# Patient Record
Sex: Male | Born: 1970
Health system: Southern US, Community
[De-identification: ages and names within clinical notes are randomized; demographics above are authoritative.]

## PROBLEM LIST (undated history)

## (undated) DIAGNOSIS — K219 Gastro-esophageal reflux disease without esophagitis: Secondary | ICD-10-CM

## (undated) DIAGNOSIS — I10 Essential (primary) hypertension: Secondary | ICD-10-CM

## (undated) DIAGNOSIS — E119 Type 2 diabetes mellitus without complications: Secondary | ICD-10-CM

## (undated) DIAGNOSIS — J309 Allergic rhinitis, unspecified: Secondary | ICD-10-CM

## (undated) DIAGNOSIS — J069 Acute upper respiratory infection, unspecified: Secondary | ICD-10-CM

## (undated) HISTORY — DX: Allergic rhinitis, unspecified: J30.9

## (undated) HISTORY — PX: HAND SURGERY: SHX662

## (undated) HISTORY — PX: SHOULDER SURGERY: SHX246

## (undated) HISTORY — PX: ESOPHAGEAL DILATION: SHX303

## (undated) HISTORY — DX: Essential (primary) hypertension: I10

## (undated) HISTORY — DX: Type 2 diabetes mellitus without complications: E11.9

## (undated) HISTORY — DX: Acute upper respiratory infection, unspecified: J06.9

## (undated) HISTORY — DX: Gastro-esophageal reflux disease without esophagitis: K21.9

## (undated) HISTORY — PX: KNEE SURGERY: SHX244

---

## 2003-11-03 ENCOUNTER — Encounter: Admission: RE | Admit: 2003-11-03 | Discharge: 2003-11-03 | Payer: Self-pay | Admitting: Family Medicine

## 2010-01-02 ENCOUNTER — Encounter: Admission: RE | Admit: 2010-01-02 | Discharge: 2010-01-02 | Payer: Self-pay | Admitting: Specialist

## 2011-02-18 ENCOUNTER — Other Ambulatory Visit: Payer: Self-pay | Admitting: Gastroenterology

## 2011-02-28 ENCOUNTER — Ambulatory Visit
Admission: RE | Admit: 2011-02-28 | Discharge: 2011-02-28 | Disposition: A | Discharge status: Home / Self Care | Payer: 59 | Source: Ambulatory Visit | Attending: Gastroenterology | Admitting: Gastroenterology

## 2011-04-08 ENCOUNTER — Ambulatory Visit (HOSPITAL_COMMUNITY)
Admission: RE | Admit: 2011-04-08 | Discharge: 2011-04-08 | Disposition: A | Discharge status: Home / Self Care | Payer: 59 | Source: Ambulatory Visit | Attending: Gastroenterology | Admitting: Gastroenterology

## 2011-04-08 DIAGNOSIS — K222 Esophageal obstruction: Secondary | ICD-10-CM | POA: Insufficient documentation

## 2011-04-08 DIAGNOSIS — K219 Gastro-esophageal reflux disease without esophagitis: Secondary | ICD-10-CM | POA: Insufficient documentation

## 2011-04-08 DIAGNOSIS — Z79899 Other long term (current) drug therapy: Secondary | ICD-10-CM | POA: Insufficient documentation

## 2011-04-08 DIAGNOSIS — K449 Diaphragmatic hernia without obstruction or gangrene: Secondary | ICD-10-CM | POA: Insufficient documentation

## 2011-04-08 LAB — GLUCOSE, CAPILLARY: Glucose-Capillary: 122 mg/dL — ABNORMAL HIGH (ref 70–99)

## 2011-04-16 NOTE — Op Note (Signed)
  NAMEMarland Kitchen  AL, BRACEWELL NO.:  [id number]  MEDICAL RECORD NO.:  [id number]           PATIENT TYPE:  O  LOCATION:  [facility]                         FACILITY:  [facility]  PHYSICIAN:  Graylin Shiver, M.D.   DATE OF BIRTH:  [birth year]  DATE OF PROCEDURE:  04/08/2011 DATE OF DISCHARGE:                              OPERATIVE REPORT   PROCEDURE:  Esophagogastroduodenoscopy with endoscopic balloon dilatation of a Schatzki ring.  INDICATIONS FOR PROCEDURE:  The patient is a 41 year old male with intermittent dysphagia to solid foods.  A barium swallow showed a Schatzki ring and a small hiatal hernia.  Informed consent was obtained after explanation of the risks of bleeding, infection, and perforation.  PREMEDICATION: 1. Fentanyl 75 mcg IV. 2. Versed 6 mg IV.  PROCEDURE:  With the patient in the left lateral decubitus position, the Pentax gastroscope was inserted into the oropharynx and passed into the esophagus.  It was advanced down the esophagus to the lower esophagus where a Schatzki ring was noted.  There was initially a little resistance to the passage of the scope into the stomach.  After passing the scope into the stomach, a hiatal hernia was present.  The scope was advanced to the second portion of the duodenum.  The second portion and bulb of the duodenum were normal.  The gastric mucosa looked normal.  No lesions were seen in the fundus or cardia of the stomach.  An endoscopic balloon dilator was advanced down the scope and placed at the level of the Schatzki ring.  It was inflated to 15 mm and held in place for 1 minute.  It was then increased in size to 16.5 mm and held in place for 1 minute.  The balloon was then deflated and removed.  There was some heme at the site of the dilation.  The rest of the esophagus looked normal.  He tolerated the procedure well without complications.  IMPRESSION: 1. Schatzki ring dilated to 16.5 mm. 2. Hiatal hernia.  PLAN:   Observe response to the dilatation.          ______________________________ Graylin Shiver, M.D.     SFG/MEDQ  D:  04/08/2011  T:  04/09/2011  Job:  [id number]  cc:   Kandyce Rud, MD Fax: [phone]  Electronically Signed by Herbert Moors MD on 04/16/2011 10:04:14 AM

## 2014-04-21 ENCOUNTER — Telehealth: Payer: Self-pay | Admitting: Cardiovascular Disease

## 2014-04-21 NOTE — Telephone Encounter (Signed)
Closed encounter °

## 2014-04-27 ENCOUNTER — Telehealth: Payer: Self-pay | Admitting: Cardiovascular Disease

## 2014-04-27 ENCOUNTER — Ambulatory Visit: Payer: 59 | Admitting: Cardiovascular Disease

## 2014-04-28 NOTE — Telephone Encounter (Signed)
Closed encounter °

## 2014-06-07 ENCOUNTER — Ambulatory Visit (INDEPENDENT_AMBULATORY_CARE_PROVIDER_SITE_OTHER): Payer: 59 | Admitting: Cardiovascular Disease

## 2014-06-07 ENCOUNTER — Encounter: Payer: Self-pay | Admitting: Cardiovascular Disease

## 2014-06-07 VITALS — BP 146/72 | HR 65 | Ht 78.0 in | Wt 275.9 lb

## 2014-06-07 DIAGNOSIS — I10 Essential (primary) hypertension: Secondary | ICD-10-CM

## 2014-06-07 DIAGNOSIS — E119 Type 2 diabetes mellitus without complications: Secondary | ICD-10-CM | POA: Insufficient documentation

## 2014-06-07 NOTE — Progress Notes (Signed)
     06/07/2014 Mitchell Rodriguez   07/04/1971  [id number]  Primary Physician Darrow BussingKOIRALA,DIBAS, MD Primary Cardiologist: Runell GessJonathan J. Zimere Dunlevy MD Roseanne RenoFACP,FACC,FAHA, FSCAI   HPI:  Mitchell Rodriguez is  a delightful 43 year old moderately overweight married Caucasian male father of 2 children patient of Dr. Docia ChuckKoirala . 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 He is a positive family history of heart disease the father had bypass surgery in his late 4940s. History of hypertension and type 2 diabetes. He has never had a heart attack or stroke and denies chest pain or shortness of breath   Current Outpatient Prescriptions  Medication Sig Dispense Refill  . aspirin 81 MG tablet Take 81 mg by mouth daily.      . hydrochlorothiazide (MICROZIDE) 12.5 MG capsule Take 1 capsule by mouth daily.       . metFORMIN (GLUCOPHAGE-XR) 500 MG 24 hr tablet Take 2 tablets by mouth daily.       No current facility-administered medications for this visit.    Allergies  Allergen Reactions  . Sulfur     History   Social History  . Marital Status: Married    Spouse Name: N/A    Number of Children: N/A  . Years of Education: N/A   Occupational History  . Not on file.   Social History Main Topics  . Smoking status: Never Smoker   . Smokeless tobacco: Never Used  . Alcohol Use: 0.5 - 1.0 oz/week    1-2 drink(s) per week     Comment: BEERS  . Drug Use: Not on file  . Sexual Activity: Not on file   Other Topics Concern  . Not on file   Social History Narrative  . No narrative on file     Review of Systems: General: negative for chills, fever, night sweats or weight changes.  Cardiovascular: negative for chest pain, dyspnea on exertion, edema, orthopnea, palpitations, paroxysmal nocturnal dyspnea or shortness of breath Dermatological: negative for rash Respiratory: negative for cough or wheezing Urologic: negative for  hematuria Abdominal: negative for nausea, vomiting, diarrhea, bright red blood per rectum, melena, or hematemesis Neurologic: negative for visual changes, syncope, or dizziness All other systems reviewed and are otherwise negative except as noted above.    Blood pressure 146/72, pulse 65, height 6\' 6"  (1.981 m), weight 275 lb 14.4 oz (125.147 kg).  General appearance: alert and no distress Neck: no adenopathy, no carotid bruit, no JVD, supple, symmetrical, trachea midline and thyroid not enlarged, symmetric, no tenderness/mass/nodules Lungs: clear to auscultation bilaterally Heart: regular rate and rhythm, S1, S2 normal, no murmur, click, rub or gallop Extremities: extremities normal, atraumatic, no cyanosis or edema  EKG normal sinus rhythm at 65 without ST or T wave changes  ASSESSMENT AND PLAN:   Essential hypertension Controlled on current medications  Diabetes Managed and followed by his PCP      Runell GessJonathan J. Makylie Rivere MD [facility] Lakes[facility] HospitalFACP,FACC,FAHA, [facility] CenterFSCAI 06/07/2014 10:31 AM

## 2014-06-07 NOTE — Patient Instructions (Signed)
Your physician has requested that you have a exercise tolerance test. For further information please visit [email or web address]. Please also follow instruction sheet, as given.  Your physician recommends that you schedule a follow-up appointment in: As needed or if test is abnormal.

## 2014-06-07 NOTE — Assessment & Plan Note (Signed)
Controlled on current medications 

## 2014-06-07 NOTE — Assessment & Plan Note (Signed)
Managed and followed by his PCP

## 2014-06-20 ENCOUNTER — Telehealth (HOSPITAL_COMMUNITY): Payer: Self-pay

## 2014-06-20 NOTE — Telephone Encounter (Signed)
Encounter complete. 

## 2014-06-22 ENCOUNTER — Encounter (HOSPITAL_COMMUNITY): Payer: 59

## 2014-06-30 ENCOUNTER — Telehealth (HOSPITAL_COMMUNITY): Payer: Self-pay

## 2014-06-30 NOTE — Telephone Encounter (Signed)
Encounter complete. 

## 2014-07-05 ENCOUNTER — Ambulatory Visit (HOSPITAL_COMMUNITY)
Admission: RE | Admit: 2014-07-05 | Discharge: 2014-07-05 | Disposition: A | Discharge status: Home / Self Care | Payer: 59 | Source: Ambulatory Visit | Attending: Cardiovascular Disease | Admitting: Cardiovascular Disease

## 2014-07-05 DIAGNOSIS — I10 Essential (primary) hypertension: Secondary | ICD-10-CM | POA: Diagnosis present

## 2014-07-05 DIAGNOSIS — Z8249 Family history of ischemic heart disease and other diseases of the circulatory system: Secondary | ICD-10-CM

## 2014-07-05 NOTE — Procedures (Signed)
Exercise Treadmill Test   Test  Exercise Tolerance Test Ordering MD: Nanetta BattyJonathan Berry, MD  Interpreting MD:  Unique Test No:1  Treadmill:  1  Indication for ETT: Work Physical/Family History CAD-Evaluate for Ischemia  Contraindication to ETT: No   Stress Modality: exercise - treadmill  Cardiac Imaging Performed: non   Protocol: standard Bruce - maximal  Max BP:  199/109  Max MPHR (bpm): 175 85% MPR (bpm):  149  MPHR obtained (bpm): 176 % MPHR obtained:  100  Reached 85% MPHR (min:sec):  9:05 Total Exercise Time (min-sec):  11:01  Workload in METS: 13.40 Borg Scale:   Reason ETT Terminated:  leg cramps    ST Segment Analysis At Rest: normal ST segments - no evidence of significant ST depression With Exercise: no evidence of significant ST depression  Other Information Arrhythmia:  No Angina during ETT:  absent (0) Quality of ETT:  diagnostic  ETT Interpretation:  normal - no evidence of ischemia by ST analysis  Comments: Duke Score of +11 Excellent exercise tolerance Hypertensive BP response to exercise Normal HR and BP recovery  Chrystie NoseKenneth C. Carollee Nussbaumer, MD, [facility] HospitalFACC Attending Cardiologist [facility]

## 2014-07-07 ENCOUNTER — Encounter: Payer: Self-pay | Admitting: *Deleted

## 2016-12-08 ENCOUNTER — Ambulatory Visit (INDEPENDENT_AMBULATORY_CARE_PROVIDER_SITE_OTHER): Payer: 59 | Admitting: Allergy and Immunology

## 2016-12-08 ENCOUNTER — Encounter: Payer: Self-pay | Admitting: Allergy and Immunology

## 2016-12-08 ENCOUNTER — Encounter (INDEPENDENT_AMBULATORY_CARE_PROVIDER_SITE_OTHER): Payer: Self-pay

## 2016-12-08 VITALS — BP 138/78 | HR 78 | Temp 98.5°F | Resp 16 | Ht 75.0 in | Wt 299.0 lb

## 2016-12-08 DIAGNOSIS — J3089 Other allergic rhinitis: Secondary | ICD-10-CM | POA: Insufficient documentation

## 2016-12-08 DIAGNOSIS — J320 Chronic maxillary sinusitis: Secondary | ICD-10-CM

## 2016-12-08 DIAGNOSIS — J329 Chronic sinusitis, unspecified: Secondary | ICD-10-CM | POA: Insufficient documentation

## 2016-12-08 DIAGNOSIS — I10 Essential (primary) hypertension: Secondary | ICD-10-CM | POA: Diagnosis not present

## 2016-12-08 DIAGNOSIS — R062 Wheezing: Secondary | ICD-10-CM

## 2016-12-08 MED ORDER — FLUTICASONE PROPIONATE 50 MCG/ACT NA SUSP: 1.0000 | Freq: Every day | NASAL | 5 refills | Status: DC | PRN

## 2016-12-08 MED ORDER — AZELASTINE HCL 0.1 % NA SOLN: 1.0000 | Freq: Two times a day (BID) | NASAL | 5 refills | Status: DC | PRN

## 2016-12-08 MED ORDER — ALBUTEROL SULFATE 108 (90 BASE) MCG/ACT IN AEPB: 1.0000 | INHALATION_SPRAY | RESPIRATORY_TRACT | 1 refills | Status: DC

## 2016-12-08 MED ORDER — AZELASTINE-FLUTICASONE 137-50 MCG/ACT NA SUSP: 1.0000 | Freq: Every day | NASAL | 5 refills | Status: DC | PRN

## 2016-12-08 NOTE — Addendum Note (Signed)
Addended by: Mliss FritzBLACK, Pattie Flaharty I on: 12/08/2016 11:03 AM   Modules accepted: Orders

## 2016-12-08 NOTE — Assessment & Plan Note (Signed)
   Currently well controlled with antihypertensives, however I recommended against the use of pseudoephedrine or phenylephrine as these medications may elevate blood pressure.

## 2016-12-08 NOTE — Patient Instructions (Addendum)
Perennial and seasonal allergic rhinitis  Aeroallergen avoidance measures have been discussed and provided in written form.  A prescription has been provided for Dymista (azelastine/fluticasone) nasal spray, 1 spray per nostril twice daily as needed. Proper nasal spray technique has been discussed and demonstrated.  Nasal saline lavage (NeilMed) has been recommended prior to medicated nasal sprays and as needed along with instructions for proper administration.  For thick post nasal drainage, add guaifenesin 1200 mg (Mucinex Maximum Strength)  twice daily as needed with adequate hydration as discussed.  If allergen avoidance measures and medications fail to adequately relieve symptoms, aeroallergen immunotherapy will be considered.  Coughing/wheezing  Treatment plan as outlined above.  A prescription has been provided for ProAir Respiclick, 1-2 inhalations every 4-6 hours as needed.  Subjective and objective measures of pulmonary function will be followed and the treatment plan will be adjusted accordingly.  Essential hypertension  Currently well controlled with antihypertensives, however I recommended against the use of pseudoephedrine or phenylephrine as these medications may elevate blood pressure.   Return in about 4 months (around 04/07/2017), or if symptoms worsen or fail to improve.  Reducing Pollen Exposure  The American Academy of Allergy, Asthma and Immunology suggests the following steps to reduce your exposure to pollen during allergy seasons.    1. Do not hang sheets or clothing out to dry; pollen may collect on these items. 2. Do not mow lawns or spend time around freshly cut grass; mowing stirs up pollen. 3. Keep windows closed at night.  Keep car windows closed while driving. 4. Minimize morning activities outdoors, a time when pollen counts are usually at their highest. 5. Stay indoors as much as possible when pollen counts or humidity is high and on windy days when  pollen tends to remain in the air longer. 6. Use air conditioning when possible.  Many air conditioners have filters that trap the pollen spores. 7. Use a HEPA room air filter to remove pollen form the indoor air you breathe.   Control of Mold Allergen  Mold and fungi can grow on a variety of surfaces provided certain temperature and moisture conditions exist.  Outdoor molds grow on plants, decaying vegetation and soil.  The major outdoor mold, Alternaria and Cladosporium, are found in very high numbers during hot and dry conditions.  Generally, a late Summer - Fall peak is seen for common outdoor fungal spores.  Rain will temporarily lower outdoor mold spore count, but counts rise rapidly when the rainy period ends.  The most important indoor molds are Aspergillus and Penicillium.  Dark, humid and poorly ventilated basements are ideal sites for mold growth.  The next most common sites of mold growth are the bathroom and the kitchen.  Outdoor [employer] 1. Use air conditioning and keep windows closed 2. Avoid exposure to decaying vegetation. 3. Avoid leaf raking. 4. Avoid grain handling. 5. Consider wearing a face mask if working in moldy areas.  Indoor Mold Control 1. Maintain humidity below 50%. 2. Clean washable surfaces with 5% bleach solution. 3. Remove sources e.g. Contaminated carpets.  Control of Dog or Cat Allergen  Avoidance is the best way to manage a dog or cat allergy. If you have a dog or cat and are allergic to dog or cats, consider removing the dog or cat from the home. If you have a dog or cat but don't want to find it a new home, or if your family wants a pet even though someone in the household is  allergic, here are some strategies that may help keep symptoms at bay:  1. Keep the pet out of your bedroom and restrict it to only a few rooms. Be advised that keeping the dog or cat in only one room will not limit the allergens to that room. 2. Don't pet, hug or kiss the  dog or cat; if you do, wash your hands with soap and water. 3. High-efficiency particulate air (HEPA) cleaners run continuously in a bedroom or living room can reduce allergen levels over time. 4. Regular use of a high-efficiency vacuum cleaner or a central vacuum can reduce allergen levels. 5. Giving your dog or cat a bath at least once a week can reduce airborne allergen.  Control of House Dust Mite Allergen  House dust mites play a major role in allergic asthma and rhinitis.  They occur in environments with high humidity wherever human skin, the food for dust mites is found. High levels have been detected in dust obtained from mattresses, pillows, carpets, upholstered furniture, bed covers, clothes and soft toys.  The principal allergen of the house dust mite is found in its feces.  A gram of dust may contain 1,000 mites and 250,000 fecal particles.  Mite antigen is easily measured in the air during house cleaning activities.    1. Encase mattresses, including the box spring, and pillow, in an air tight cover.  Seal the zipper end of the encased mattresses with wide adhesive tape. 2. Wash the bedding in water of 130 degrees Farenheit weekly.  Avoid cotton comforters/quilts and flannel bedding: the most ideal bed covering is the dacron comforter. 3. Remove all upholstered furniture from the bedroom. 4. Remove carpets, carpet padding, rugs, and non-washable window drapes from the bedroom.  Wash drapes weekly or use plastic window coverings. 5. Remove all non-washable stuffed toys from the bedroom.  Wash stuffed toys weekly. 6. Have the room cleaned frequently with a vacuum cleaner and a damp dust-mop.  The patient should not be in a room which is being cleaned and should wait 1 hour after cleaning before going into the room. 7. Close and seal all heating outlets in the bedroom.  Otherwise, the room will become filled with dust-laden air.  An electric heater can be used to heat the room. 8. Reduce  indoor humidity to less than 50%.  Do not use a humidifier.  Control of Cockroach Allergen  Cockroach allergen has been identified as an important cause of acute attacks of asthma, especially in urban settings.  There are fifty-five species of cockroach that exist in the [place], however only three, the [place], [place] and Oriental species produce allergen that can affect patients with Asthma.  Allergens can be obtained from fecal particles, egg casings and secretions from cockroaches.    1. Remove food sources. 2. Reduce access to water. 3. Seal access and entry points. 4. Spray runways with 0.5-1% Diazinon or Chlorpyrifos 5. Blow boric acid power under stoves and refrigerator. 6. Place bait stations (hydramethylnon) at feeding sites.

## 2016-12-08 NOTE — Progress Notes (Addendum)
New Patient Note  RE: Mitchell Rodriguez MRN: [id number] DOB: 09/07/1971 Date of Office Visit: 12/08/2016  Referring provider: Darrow Bussing, MD Primary care provider: Darrow Bussing, MD  Chief Complaint: Sinus Problem and Allergic Rhinitis    History of present illness: Mitchell Rodriguez is a 46 y.o. male seen today in consultation requested by Darrow Bussing, MD.  He reports that for many years he has experienced nasal congestion "all the time", as well as rhinorrhea, postnasal drainage, and occasional sinus pressure.  These symptoms occur year round but tend to be worse in the fall and in the winter.  Since summer of 2017 he has had 3 sinus infections requiring antibiotics.  He currently takes fluticasone nasal spray and over-the-counter loratadine with modest benefit.  He states that he often times evolves bronchitis during sinus infections with symptoms consisting of coughing and wheezing.   Assessment and plan: Perennial and seasonal allergic rhinitis  Aeroallergen avoidance measures have been discussed and provided in written form.  A prescription has been provided for Dymista (azelastine/fluticasone) nasal spray, 1 spray per nostril twice daily as needed. Proper nasal spray technique has been discussed and demonstrated.  Nasal saline lavage (NeilMed) has been recommended prior to medicated nasal sprays and as needed along with instructions for proper administration.  For thick post nasal drainage, add guaifenesin 1200 mg (Mucinex Maximum Strength)  twice daily as needed with adequate hydration as discussed.  If allergen avoidance measures and medications fail to adequately relieve symptoms, aeroallergen immunotherapy will be considered.  Coughing/wheezing  Treatment plan as outlined above.  A prescription has been provided for ProAir Respiclick, 1-2 inhalations every 4-6 hours as needed.  Subjective and objective measures of pulmonary function will be followed and the  treatment plan will be adjusted accordingly.  Essential hypertension  Currently well controlled with antihypertensives, however I recommended against the use of pseudoephedrine or phenylephrine as these medications may elevate blood pressure.   Meds ordered this encounter  Medications  . DISCONTD: Azelastine-Fluticasone 137-50 MCG/ACT SUSP    Sig: Place 1 spray into the nose daily as needed.    Dispense:  23 g    Refill:  5  . Albuterol Sulfate (PROAIR RESPICLICK) 108 (90 Base) MCG/ACT AEPB    Sig: Inhale 1-2 puffs into the lungs See admin instructions. Every 4-6 hours as needed    Dispense:  1 each    Refill:  1  . azelastine (ASTELIN) 0.1 % nasal spray    Sig: Place 1 spray into both nostrils 2 (two) times daily as needed.    Dispense:  30 mL    Refill:  5  . fluticasone (FLONASE) 50 MCG/ACT nasal spray    Sig: Place 1 spray into both nostrils daily as needed for allergies or rhinitis.    Dispense:  16 g    Refill:  5    Diagnostics: Spirometry: FVC was 4.31 L and FEV1 was 3.83 L (80% predicted) without post bronchodilator improvement.  Please see scanned spirometry results for details. Allergy skin testing: Positive to grass pollens, ragweed pollen, weed pollens, tree pollens, molds, dog epithelia, cockroach antigen, and dust mite antigen.    Physical examination: Blood pressure 138/78, pulse 78, temperature 98.5 F (36.9 C), temperature source Oral, resp. rate 16, height 6\' 3"  (1.905 m), weight 299 lb (135.6 kg), SpO2 98 %.  General: Alert, interactive, in no acute distress. HEENT: TMs pearly gray, turbinates edematous without discharge, post-pharynx moderately erythematous. Neck: Supple without lymphadenopathy. Lungs: Clear to auscultation  without wheezing, rhonchi or rales. CV: Normal S1, S2 without murmurs. Abdomen: Nondistended, nontender. Skin: Warm and dry, without lesions or rashes. Extremities:  No clubbing, cyanosis or edema. Neuro:   Grossly intact.  Review  of systems:  Review of systems negative except as noted in HPI / PMHx or noted below: Review of Systems  Constitutional: Negative.   HENT: Negative.   Eyes: Negative.   Respiratory: Negative.   Cardiovascular: Negative.   Gastrointestinal: Negative.   Genitourinary: Negative.   Musculoskeletal: Negative.   Skin: Negative.   Neurological: Negative.   Endo/Heme/Allergies: Negative.   Psychiatric/Behavioral: Negative.     Past medical history:  Past Medical History:  Diagnosis Date  . Diabetes mellitus without complication (HCC)   . Hypertension   . Recurrent upper respiratory infection (URI)     Past surgical history:  Past Surgical History:  Procedure Laterality Date  . HAND SURGERY    . KNEE SURGERY     acl repair  . SHOULDER SURGERY     rotator cuff repair    Family history: Family History  Problem Relation Age of Onset  . Cancer - Other Mother     uterine  . Heart disease Father   . Hypertension Father   . Heart disease Maternal Grandmother   . COPD Maternal Grandmother   . Cancer - Other Paternal Grandfather   . Hypertension Brother     Social history: Social History   Social History  . Marital status: Married    Spouse name: N/A  . Number of children: N/A  . Years of education: N/A   Occupational History  . Not on file.   Social History Main Topics  . Smoking status: Never Smoker  . Smokeless tobacco: Never Used  . Alcohol use 0.5 - 1.0 oz/week    1 - 2 Standard drinks or equivalent per week     Comment: BEERS  . Drug use: No  . Sexual activity: Not on file   Other Topics Concern  . Not on file   Social History Narrative  . No narrative on file   Environmental History: The patient lives in a 49 year old house with carpeting throughout, gas heat, and central air.  There are dogs in the House which have access to his bedroom.  He is a nonsmoker but is exposed to fumes, chemicals, and dust at his place of employment.  Allergies as of  12/08/2016      Reactions   Sulfamethoxazole Other (See Comments)   unknown   Sulfur       Medication List       Accurate as of 12/08/16 11:05 AM. Always use your most recent med list.          aspirin 81 MG tablet Take 81 mg by mouth daily.   azelastine 0.1 % nasal spray Commonly known as:  ASTELIN Place 1 spray into both nostrils 2 (two) times daily as needed.   fluticasone 50 MCG/ACT nasal spray Commonly known as:  FLONASE Place 1 spray into both nostrils daily as needed for allergies or rhinitis.   glimepiride 1 MG tablet Commonly known as:  AMARYL Take 1 tablet by mouth daily.   hydrochlorothiazide 12.5 MG capsule Commonly known as:  MICROZIDE Take 1 capsule by mouth daily.   metFORMIN 500 MG 24 hr tablet Commonly known as:  GLUCOPHAGE-XR Take 4 tablets by mouth daily.   VENTOLIN HFA 108 (90 Base) MCG/ACT inhaler Generic drug:  albuterol   Albuterol Sulfate 108 (  90 Base) MCG/ACT Aepb Commonly known as:  PROAIR RESPICLICK Inhale 1-2 puffs into the lungs See admin instructions. Every 4-6 hours as needed       Known medication allergies: Allergies  Allergen Reactions  . Sulfamethoxazole Other (See Comments)    unknown  . Sulfur     I appreciate the opportunity to take part in Kelven's care. Please do not hesitate to contact me with questions.  Sincerely,   R. Jorene Guestarter Larkin Alfred, MD

## 2016-12-08 NOTE — Addendum Note (Signed)
Addended by: Candis SchatzBOBBITT, Kentrail Shew C on: 12/08/2016 11:05 AM   Modules accepted: Orders

## 2016-12-08 NOTE — Assessment & Plan Note (Addendum)
   Treatment plan as outlined above.  A prescription has been provided for ProAir Respiclick, 1-2 inhalations every 4-6 hours as needed.  Subjective and objective measures of pulmonary function will be followed and the treatment plan will be adjusted accordingly.

## 2016-12-08 NOTE — Assessment & Plan Note (Signed)
   Aeroallergen avoidance measures have been discussed and provided in written form.  A prescription has been provided for Dymista (azelastine/fluticasone) nasal spray, 1 spray per nostril twice daily as needed. Proper nasal spray technique has been discussed and demonstrated.  Nasal saline lavage (NeilMed) has been recommended prior to medicated nasal sprays and as needed along with instructions for proper administration.  For thick post nasal drainage, add guaifenesin 1200 mg (Mucinex Maximum Strength)  twice daily as needed with adequate hydration as discussed.  If allergen avoidance measures and medications fail to adequately relieve symptoms, aeroallergen immunotherapy will be considered.

## 2016-12-17 DIAGNOSIS — I10 Essential (primary) hypertension: Secondary | ICD-10-CM | POA: Diagnosis not present

## 2016-12-17 DIAGNOSIS — E1165 Type 2 diabetes mellitus with hyperglycemia: Secondary | ICD-10-CM | POA: Diagnosis not present

## 2016-12-17 DIAGNOSIS — R740 Nonspecific elevation of levels of transaminase and lactic acid dehydrogenase [LDH]: Secondary | ICD-10-CM | POA: Diagnosis not present

## 2017-02-02 DIAGNOSIS — J209 Acute bronchitis, unspecified: Secondary | ICD-10-CM | POA: Diagnosis not present

## 2017-02-11 DIAGNOSIS — J329 Chronic sinusitis, unspecified: Secondary | ICD-10-CM | POA: Diagnosis not present

## 2017-02-11 DIAGNOSIS — J45901 Unspecified asthma with (acute) exacerbation: Secondary | ICD-10-CM | POA: Diagnosis not present

## 2017-02-11 DIAGNOSIS — E1165 Type 2 diabetes mellitus with hyperglycemia: Secondary | ICD-10-CM | POA: Diagnosis not present

## 2017-02-11 DIAGNOSIS — J209 Acute bronchitis, unspecified: Secondary | ICD-10-CM | POA: Diagnosis not present

## 2017-02-25 DIAGNOSIS — R062 Wheezing: Secondary | ICD-10-CM | POA: Diagnosis not present

## 2017-03-31 DIAGNOSIS — J328 Other chronic sinusitis: Secondary | ICD-10-CM | POA: Diagnosis not present

## 2017-03-31 DIAGNOSIS — J342 Deviated nasal septum: Secondary | ICD-10-CM | POA: Diagnosis not present

## 2017-03-31 DIAGNOSIS — J209 Acute bronchitis, unspecified: Secondary | ICD-10-CM | POA: Diagnosis not present

## 2017-03-31 DIAGNOSIS — J329 Chronic sinusitis, unspecified: Secondary | ICD-10-CM | POA: Diagnosis not present

## 2017-04-02 ENCOUNTER — Other Ambulatory Visit: Payer: Self-pay | Admitting: Pulmonary Disease

## 2017-04-02 ENCOUNTER — Ambulatory Visit (INDEPENDENT_AMBULATORY_CARE_PROVIDER_SITE_OTHER): Payer: 59 | Admitting: Pulmonary Disease

## 2017-04-02 ENCOUNTER — Other Ambulatory Visit (INDEPENDENT_AMBULATORY_CARE_PROVIDER_SITE_OTHER): Payer: 59

## 2017-04-02 ENCOUNTER — Encounter: Payer: Self-pay | Admitting: Pulmonary Disease

## 2017-04-02 VITALS — BP 144/80 | HR 79 | Ht 76.0 in | Wt 299.0 lb

## 2017-04-02 DIAGNOSIS — R05 Cough: Secondary | ICD-10-CM

## 2017-04-02 DIAGNOSIS — K219 Gastro-esophageal reflux disease without esophagitis: Secondary | ICD-10-CM | POA: Diagnosis not present

## 2017-04-02 DIAGNOSIS — J309 Allergic rhinitis, unspecified: Secondary | ICD-10-CM

## 2017-04-02 DIAGNOSIS — R059 Cough, unspecified: Secondary | ICD-10-CM

## 2017-04-02 LAB — CBC WITH DIFFERENTIAL/PLATELET
Basophils Absolute: 0.1 10*3/uL (ref 0.0–0.1)
Basophils Relative: 1.1 % (ref 0.0–3.0)
Eosinophils Absolute: 0.3 10*3/uL (ref 0.0–0.7)
Eosinophils Relative: 2.8 % (ref 0.0–5.0)
HCT: 47.1 % (ref 39.0–52.0)
Hemoglobin: 16 g/dL (ref 13.0–17.0)
Lymphocytes Relative: 20.6 % (ref 12.0–46.0)
Lymphs Abs: 2.1 10*3/uL (ref 0.7–4.0)
MCHC: 34 g/dL (ref 30.0–36.0)
MCV: 87.3 fl (ref 78.0–100.0)
Monocytes Absolute: 0.9 10*3/uL (ref 0.1–1.0)
Monocytes Relative: 8.7 % (ref 3.0–12.0)
Neutro Abs: 6.7 10*3/uL (ref 1.4–7.7)
Neutrophils Relative %: 66.8 % (ref 43.0–77.0)
Platelets: 301 10*3/uL (ref 150.0–400.0)
RBC: 5.39 Mil/uL (ref 4.22–5.81)
RDW: 14.3 % (ref 11.5–15.5)
WBC: 10 10*3/uL (ref 4.0–10.5)

## 2017-04-02 MED ORDER — MONTELUKAST SODIUM 10 MG PO TABS: 10.0000 mg | ORAL_TABLET | Freq: Every day | ORAL | 3 refills | Status: DC
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
## 2017-04-02 NOTE — Patient Instructions (Addendum)
   Try to stop using your Advair.  Continue using your rescue inhaler and other medications as prescribed.  Start taking Zantac 150mg  at night before bed (generic is Ranitidine).  We will review your test results when I see you back.  Call me if you have any questions or new breathing problems.  TESTS ORDERED: 1. Full PFTs on or before next appointment 2. Serum RAST Panel & CBC w/ Differential

## 2017-04-03 LAB — RESPIRATORY ALLERGY PROFILE REGION II ~~LOC~~
Allergen, A. alternata, m6: <0.1 kU/L
Allergen, C. Herbarum, M2: <0.1 kU/L
Allergen, Cedar tree, t12: 0.11 kU/L — ABNORMAL HIGH
Allergen, Comm Silver Birch, t9: <0.1 kU/L
Allergen, Cottonwood, t14: 0.13 kU/L — ABNORMAL HIGH
Allergen, D pternoyssinus,d7: <0.1 kU/L
Allergen, Mouse Urine Protein, e78: <0.1 kU/L
Allergen, Mulberry, t76: <0.1 kU/L
Allergen, Oak,t7: 0.18 kU/L — ABNORMAL HIGH
Allergen, P. notatum, m1: <0.1 kU/L
Aspergillus fumigatus, m3: <0.1 kU/L
Bermuda Grass: 0.17 kU/L — ABNORMAL HIGH
Box Elder IgE: 0.14 kU/L — ABNORMAL HIGH
Cat Dander: <0.1 kU/L
Cockroach: 0.17 kU/L — ABNORMAL HIGH
Common Ragweed: 0.14 kU/L — ABNORMAL HIGH
D. farinae: <0.1 kU/L
Dog Dander: <0.1 kU/L
Elm IgE: 0.16 kU/L — ABNORMAL HIGH
IgE (Immunoglobulin E), Serum: 263 kU/L — ABNORMAL HIGH (ref <115)
Johnson Grass: 0.2 kU/L — ABNORMAL HIGH
Pecan/Hickory Tree IgE: <0.1 kU/L
Rough Pigweed  IgE: 0.12 kU/L — ABNORMAL HIGH
Sheep Sorrel IgE: 0.19 kU/L — ABNORMAL HIGH
Timothy Grass: 0.21 kU/L — ABNORMAL HIGH

## 2017-04-07 ENCOUNTER — Ambulatory Visit: Payer: 59 | Admitting: Allergy and Immunology

## 2017-04-29 ENCOUNTER — Other Ambulatory Visit: Payer: Self-pay

## 2017-04-29 DIAGNOSIS — E1165 Type 2 diabetes mellitus with hyperglycemia: Secondary | ICD-10-CM | POA: Diagnosis not present

## 2017-04-29 DIAGNOSIS — I10 Essential (primary) hypertension: Secondary | ICD-10-CM | POA: Diagnosis not present

## 2017-04-29 DIAGNOSIS — J3089 Other allergic rhinitis: Secondary | ICD-10-CM

## 2017-04-29 DIAGNOSIS — J320 Chronic maxillary sinusitis: Secondary | ICD-10-CM

## 2017-04-29 MED ORDER — AZELASTINE HCL 0.1 % NA SOLN: 1.0000 | Freq: Two times a day (BID) | NASAL | 5 refills | Status: DC | PRN

## 2017-04-29 MED ORDER — FLUTICASONE PROPIONATE 50 MCG/ACT NA SUSP: 1.0000 | Freq: Two times a day (BID) | NASAL | 5 refills | Status: DC | PRN

## 2017-04-29 NOTE — Telephone Encounter (Signed)
Split Dymista rx.

## 2017-04-30 LAB — PULMONARY FUNCTION TEST

## 2017-05-01 ENCOUNTER — Other Ambulatory Visit: Payer: Self-pay | Admitting: Family Medicine

## 2017-05-01 DIAGNOSIS — R740 Nonspecific elevation of levels of transaminase and lactic acid dehydrogenase [LDH]: Principal | ICD-10-CM

## 2017-05-01 DIAGNOSIS — R7401 Elevation of levels of liver transaminase levels: Secondary | ICD-10-CM

## 2017-05-13 ENCOUNTER — Ambulatory Visit
Admission: RE | Admit: 2017-05-13 | Discharge: 2017-05-13 | Disposition: A | Discharge status: Home / Self Care | Payer: 59 | Source: Ambulatory Visit | Attending: Family Medicine | Admitting: Family Medicine

## 2017-05-13 DIAGNOSIS — R740 Nonspecific elevation of levels of transaminase and lactic acid dehydrogenase [LDH]: Secondary | ICD-10-CM | POA: Diagnosis not present

## 2017-05-13 DIAGNOSIS — R7401 Elevation of levels of liver transaminase levels: Secondary | ICD-10-CM

## 2017-05-15 ENCOUNTER — Ambulatory Visit (INDEPENDENT_AMBULATORY_CARE_PROVIDER_SITE_OTHER): Payer: 59 | Admitting: Pulmonary Disease

## 2017-05-15 ENCOUNTER — Encounter: Payer: Self-pay | Admitting: Pulmonary Disease

## 2017-05-15 VITALS — BP 120/80 | HR 74 | Ht 76.0 in | Wt 291.0 lb

## 2017-05-15 DIAGNOSIS — J4530 Mild persistent asthma, uncomplicated: Secondary | ICD-10-CM | POA: Diagnosis not present

## 2017-05-15 DIAGNOSIS — K219 Gastro-esophageal reflux disease without esophagitis: Secondary | ICD-10-CM | POA: Diagnosis not present

## 2017-05-15 DIAGNOSIS — J309 Allergic rhinitis, unspecified: Secondary | ICD-10-CM

## 2017-05-15 DIAGNOSIS — R059 Cough, unspecified: Secondary | ICD-10-CM

## 2017-05-15 DIAGNOSIS — J302 Other seasonal allergic rhinitis: Secondary | ICD-10-CM | POA: Diagnosis not present

## 2017-05-15 DIAGNOSIS — R05 Cough: Secondary | ICD-10-CM | POA: Diagnosis not present

## 2017-05-15 LAB — PULMONARY FUNCTION TEST
DL/VA % pred: 115 %
DL/VA: 5.64 ml/min/mmHg/L
DLCO cor % pred: 111 %
DLCO cor: 43.68 ml/min/mmHg
DLCO unc % pred: 111 %
DLCO unc: 43.55 ml/min/mmHg
FEF 25-75 Post: 4.92 L/sec
FEF 25-75 Pre: 4.2 L/sec
FEF2575-%Change-Post: 16 %
FEF2575-%Pred-Post: 121 %
FEF2575-%Pred-Pre: 103 %
FEV1-%Change-Post: 3 %
FEV1-%Pred-Post: 89 %
FEV1-%Pred-Pre: 86 %
FEV1-Post: 4.18 L
FEV1-Pre: 4.03 L
FEV1FVC-%Change-Post: 3 %
FEV1FVC-%Pred-Pre: 106 %
FEV6-%Change-Post: 0 %
FEV6-%Pred-Post: 83 %
FEV6-%Pred-Pre: 83 %
FEV6-Post: 4.85 L
FEV6-Pre: 4.84 L
FEV6FVC-%Change-Post: 0 %
FEV6FVC-%Pred-Post: 103 %
FEV6FVC-%Pred-Pre: 102 %
FVC-%Change-Post: 0 %
FVC-%Pred-Post: 81 %
FVC-%Pred-Pre: 81 %
FVC-Post: 4.85 L
FVC-Pre: 4.85 L
Post FEV1/FVC ratio: 86 %
Post FEV6/FVC ratio: 100 %
Pre FEV1/FVC ratio: 83 %
Pre FEV6/FVC Ratio: 100 %
RV % pred: 85 %
RV: 1.93 L
TLC % pred: 94 %
TLC: 7.5 L

## 2017-05-15 MED ORDER — BECLOMETHASONE DIPROPIONATE 80 MCG/ACT IN AERS: 1.0000 | INHALATION_SPRAY | Freq: Every day | RESPIRATORY_TRACT | 3 refills | Status: DC

## 2017-05-15 NOTE — Addendum Note (Signed)
Addended by: Pamalee LeydenWIGGINS, Maraki Macquarrie J on: 05/15/2017 10:46 AM   Modules accepted: Orders
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
## 2017-05-15 NOTE — Progress Notes (Signed)
PFT completed today.Odysseus Cada,CMA  

## 2017-05-15 NOTE — Patient Instructions (Signed)
   We are switching you from Advair to Qvar. You should give it 1-2 weeks to see if your coughing & wheezing improve by using the inhaler once daily. If it is not totally better, go to 1 puff twice a day. Then, if still not better you can increase to 2 puffs twice daily after 1-2 weeks.  Call my office to let us know if you are having to go up on your Qvar as above.  Remember to remove any dentures or partials you have before you use your inhaler. Remember to brush your teeth & tongue after you use your inhaler as well as rinse, gargle & spit to keep from getting thrush in your mouth or on your tongue (a white film).   Keep taking your Singulair.  Call/e-mail me if you have any new breathing problems or questions before your next appointment.  TESTS ORDERED: 1. Spirometry with bronchodilator challenge at next appointment

## 2017-05-28 DIAGNOSIS — H5212 Myopia, left eye: Secondary | ICD-10-CM | POA: Diagnosis not present

## 2017-08-25 ENCOUNTER — Ambulatory Visit (INDEPENDENT_AMBULATORY_CARE_PROVIDER_SITE_OTHER): Payer: 59 | Admitting: Pulmonary Disease

## 2017-08-25 ENCOUNTER — Other Ambulatory Visit: Payer: Self-pay | Admitting: Pulmonary Disease

## 2017-08-25 ENCOUNTER — Encounter: Payer: Self-pay | Admitting: Pulmonary Disease

## 2017-08-25 VITALS — BP 132/72 | HR 70 | Wt 298.0 lb

## 2017-08-25 DIAGNOSIS — J4530 Mild persistent asthma, uncomplicated: Secondary | ICD-10-CM

## 2017-08-25 DIAGNOSIS — K219 Gastro-esophageal reflux disease without esophagitis: Secondary | ICD-10-CM

## 2017-08-25 DIAGNOSIS — J309 Allergic rhinitis, unspecified: Secondary | ICD-10-CM | POA: Diagnosis not present

## 2017-08-25 LAB — PULMONARY FUNCTION TEST
FEF 25-75 Post: 4 L/sec
FEF 25-75 Pre: 4.31 L/sec
FEF2575-%Change-Post: -7 %
FEF2575-%Pred-Post: 99 %
FEF2575-%Pred-Pre: 106 %
FEV1-%Change-Post: -1 %
FEV1-%Pred-Post: 86 %
FEV1-%Pred-Pre: 87 %
FEV1-Post: 4.01 L
FEV1-Pre: 4.08 L
FEV1FVC-%Change-Post: 2 %
FEV1FVC-%Pred-Pre: 106 %
FEV6-%Change-Post: -3 %
FEV6-%Pred-Post: 81 %
FEV6-%Pred-Pre: 84 %
FEV6-Post: 4.7 L
FEV6-Pre: 4.89 L
FEV6FVC-%Change-Post: 0 %
FEV6FVC-%Pred-Post: 102 %
FEV6FVC-%Pred-Pre: 102 %
FVC-%Change-Post: -4 %
FVC-%Pred-Post: 78 %
FVC-%Pred-Pre: 82 %
FVC-Post: 4.71 L
FVC-Pre: 4.93 L
Post FEV1/FVC ratio: 85 %
Post FEV6/FVC ratio: 100 %
Pre FEV1/FVC ratio: 83 %
Pre FEV6/FVC Ratio: 99 %

## 2017-08-25 MED ORDER — BECLOMETHASONE DIPROP HFA 80 MCG/ACT IN AERB: 1.0000 | INHALATION_SPRAY | Freq: Every day | RESPIRATORY_TRACT | 6 refills | Status: DC

## 2017-08-25 MED ORDER — BECLOMETHASONE DIPROPIONATE 80 MCG/ACT IN AERS: 1.0000 | INHALATION_SPRAY | Freq: Every day | RESPIRATORY_TRACT | 3 refills | Status: DC
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
## 2017-08-25 NOTE — Patient Instructions (Addendum)
   Continue using your medications & inhalers as prescribed.  Call us if you have any new breathing problems or questions before your next appointment.  We will see you back in 6 months or sooner if needed.

## 2017-08-25 NOTE — Progress Notes (Signed)
Spirometry pre and post done today. 

## 2017-09-16 DIAGNOSIS — M1711 Unilateral primary osteoarthritis, right knee: Secondary | ICD-10-CM | POA: Diagnosis not present

## 2017-11-13 DIAGNOSIS — M1711 Unilateral primary osteoarthritis, right knee: Secondary | ICD-10-CM | POA: Diagnosis not present

## 2017-11-19 DIAGNOSIS — M25561 Pain in right knee: Secondary | ICD-10-CM | POA: Diagnosis not present

## 2017-12-02 DIAGNOSIS — S83281A Other tear of lateral meniscus, current injury, right knee, initial encounter: Secondary | ICD-10-CM | POA: Diagnosis not present

## 2017-12-02 DIAGNOSIS — M1711 Unilateral primary osteoarthritis, right knee: Secondary | ICD-10-CM | POA: Diagnosis not present

## 2017-12-17 DIAGNOSIS — S83281A Other tear of lateral meniscus, current injury, right knee, initial encounter: Secondary | ICD-10-CM | POA: Diagnosis not present

## 2017-12-17 DIAGNOSIS — M94261 Chondromalacia, right knee: Secondary | ICD-10-CM | POA: Diagnosis not present

## 2017-12-17 DIAGNOSIS — S83271A Complex tear of lateral meniscus, current injury, right knee, initial encounter: Secondary | ICD-10-CM | POA: Diagnosis not present

## 2017-12-17 DIAGNOSIS — G8918 Other acute postprocedural pain: Secondary | ICD-10-CM | POA: Diagnosis not present

## 2017-12-24 DIAGNOSIS — M25561 Pain in right knee: Secondary | ICD-10-CM | POA: Diagnosis not present

## 2017-12-29 DIAGNOSIS — M25561 Pain in right knee: Secondary | ICD-10-CM | POA: Diagnosis not present

## 2018-01-05 DIAGNOSIS — M25561 Pain in right knee: Secondary | ICD-10-CM | POA: Diagnosis not present

## 2018-01-12 DIAGNOSIS — M25561 Pain in right knee: Secondary | ICD-10-CM | POA: Diagnosis not present

## 2018-01-19 DIAGNOSIS — M25561 Pain in right knee: Secondary | ICD-10-CM | POA: Diagnosis not present

## 2018-01-21 DIAGNOSIS — M25561 Pain in right knee: Secondary | ICD-10-CM | POA: Diagnosis not present

## 2018-05-02 ENCOUNTER — Other Ambulatory Visit: Payer: Self-pay | Admitting: Allergy and Immunology

## 2018-05-02 DIAGNOSIS — J3089 Other allergic rhinitis: Secondary | ICD-10-CM

## 2018-05-02 DIAGNOSIS — J320 Chronic maxillary sinusitis: Secondary | ICD-10-CM

## 2018-06-15 DIAGNOSIS — M25511 Pain in right shoulder: Secondary | ICD-10-CM | POA: Diagnosis not present

## 2018-06-23 DIAGNOSIS — M25511 Pain in right shoulder: Secondary | ICD-10-CM | POA: Diagnosis not present

## 2018-07-02 DIAGNOSIS — S46011D Strain of muscle(s) and tendon(s) of the rotator cuff of right shoulder, subsequent encounter: Secondary | ICD-10-CM | POA: Diagnosis not present

## 2018-07-20 DIAGNOSIS — I10 Essential (primary) hypertension: Secondary | ICD-10-CM | POA: Diagnosis not present

## 2018-07-20 DIAGNOSIS — E1165 Type 2 diabetes mellitus with hyperglycemia: Secondary | ICD-10-CM | POA: Diagnosis not present

## 2018-07-20 DIAGNOSIS — E7800 Pure hypercholesterolemia, unspecified: Secondary | ICD-10-CM | POA: Diagnosis not present

## 2018-07-21 DIAGNOSIS — E7800 Pure hypercholesterolemia, unspecified: Secondary | ICD-10-CM | POA: Diagnosis not present

## 2018-07-21 DIAGNOSIS — E1165 Type 2 diabetes mellitus with hyperglycemia: Secondary | ICD-10-CM | POA: Diagnosis not present

## 2018-07-22 DIAGNOSIS — S46011A Strain of muscle(s) and tendon(s) of the rotator cuff of right shoulder, initial encounter: Secondary | ICD-10-CM | POA: Diagnosis not present

## 2018-07-22 DIAGNOSIS — G8918 Other acute postprocedural pain: Secondary | ICD-10-CM | POA: Diagnosis not present

## 2018-07-22 DIAGNOSIS — M19011 Primary osteoarthritis, right shoulder: Secondary | ICD-10-CM | POA: Diagnosis not present

## 2018-07-22 DIAGNOSIS — M7541 Impingement syndrome of right shoulder: Secondary | ICD-10-CM | POA: Diagnosis not present

## 2018-07-29 DIAGNOSIS — S46011D Strain of muscle(s) and tendon(s) of the rotator cuff of right shoulder, subsequent encounter: Secondary | ICD-10-CM | POA: Diagnosis not present

## 2018-07-29 DIAGNOSIS — M7541 Impingement syndrome of right shoulder: Secondary | ICD-10-CM | POA: Diagnosis not present

## 2018-07-29 DIAGNOSIS — M25611 Stiffness of right shoulder, not elsewhere classified: Secondary | ICD-10-CM | POA: Diagnosis not present

## 2018-08-04 DIAGNOSIS — M25611 Stiffness of right shoulder, not elsewhere classified: Secondary | ICD-10-CM | POA: Diagnosis not present

## 2018-08-06 DIAGNOSIS — M25611 Stiffness of right shoulder, not elsewhere classified: Secondary | ICD-10-CM | POA: Diagnosis not present

## 2018-08-10 DIAGNOSIS — M25611 Stiffness of right shoulder, not elsewhere classified: Secondary | ICD-10-CM | POA: Diagnosis not present

## 2018-08-12 DIAGNOSIS — M25611 Stiffness of right shoulder, not elsewhere classified: Secondary | ICD-10-CM | POA: Diagnosis not present

## 2018-08-18 DIAGNOSIS — M25611 Stiffness of right shoulder, not elsewhere classified: Secondary | ICD-10-CM | POA: Diagnosis not present

## 2018-08-23 DIAGNOSIS — M25611 Stiffness of right shoulder, not elsewhere classified: Secondary | ICD-10-CM | POA: Diagnosis not present

## 2018-08-26 DIAGNOSIS — M25611 Stiffness of right shoulder, not elsewhere classified: Secondary | ICD-10-CM | POA: Diagnosis not present

## 2018-08-30 DIAGNOSIS — M25611 Stiffness of right shoulder, not elsewhere classified: Secondary | ICD-10-CM | POA: Diagnosis not present

## 2018-09-06 DIAGNOSIS — M25611 Stiffness of right shoulder, not elsewhere classified: Secondary | ICD-10-CM | POA: Diagnosis not present

## 2018-09-09 DIAGNOSIS — M25611 Stiffness of right shoulder, not elsewhere classified: Secondary | ICD-10-CM | POA: Diagnosis not present

## 2018-09-13 DIAGNOSIS — M25611 Stiffness of right shoulder, not elsewhere classified: Secondary | ICD-10-CM | POA: Diagnosis not present

## 2018-09-16 DIAGNOSIS — M25611 Stiffness of right shoulder, not elsewhere classified: Secondary | ICD-10-CM | POA: Diagnosis not present

## 2018-09-23 DIAGNOSIS — M25611 Stiffness of right shoulder, not elsewhere classified: Secondary | ICD-10-CM | POA: Diagnosis not present

## 2018-09-27 DIAGNOSIS — M25611 Stiffness of right shoulder, not elsewhere classified: Secondary | ICD-10-CM | POA: Diagnosis not present

## 2018-09-30 DIAGNOSIS — M25611 Stiffness of right shoulder, not elsewhere classified: Secondary | ICD-10-CM | POA: Diagnosis not present

## 2018-10-04 DIAGNOSIS — M25611 Stiffness of right shoulder, not elsewhere classified: Secondary | ICD-10-CM | POA: Diagnosis not present

## 2018-10-19 DIAGNOSIS — M25611 Stiffness of right shoulder, not elsewhere classified: Secondary | ICD-10-CM | POA: Diagnosis not present

## 2018-10-26 DIAGNOSIS — M25611 Stiffness of right shoulder, not elsewhere classified: Secondary | ICD-10-CM | POA: Diagnosis not present

## 2018-10-28 DIAGNOSIS — M25611 Stiffness of right shoulder, not elsewhere classified: Secondary | ICD-10-CM | POA: Diagnosis not present

## 2018-12-01 DIAGNOSIS — M25511 Pain in right shoulder: Secondary | ICD-10-CM | POA: Diagnosis not present

## 2018-12-27 DIAGNOSIS — M25611 Stiffness of right shoulder, not elsewhere classified: Secondary | ICD-10-CM | POA: Diagnosis not present

## 2018-12-27 DIAGNOSIS — I10 Essential (primary) hypertension: Secondary | ICD-10-CM | POA: Diagnosis not present

## 2018-12-27 DIAGNOSIS — J069 Acute upper respiratory infection, unspecified: Secondary | ICD-10-CM | POA: Diagnosis not present

## 2018-12-27 DIAGNOSIS — E1165 Type 2 diabetes mellitus with hyperglycemia: Secondary | ICD-10-CM | POA: Diagnosis not present

## 2018-12-29 DIAGNOSIS — M25611 Stiffness of right shoulder, not elsewhere classified: Secondary | ICD-10-CM | POA: Diagnosis not present

## 2019-01-05 DIAGNOSIS — M25611 Stiffness of right shoulder, not elsewhere classified: Secondary | ICD-10-CM | POA: Diagnosis not present

## 2019-01-18 DIAGNOSIS — J4531 Mild persistent asthma with (acute) exacerbation: Secondary | ICD-10-CM | POA: Diagnosis not present

## 2019-01-21 ENCOUNTER — Ambulatory Visit: Payer: 59 | Admitting: Pulmonary Disease

## 2019-01-24 DIAGNOSIS — M25611 Stiffness of right shoulder, not elsewhere classified: Secondary | ICD-10-CM | POA: Diagnosis not present

## 2019-01-26 DIAGNOSIS — Z9889 Other specified postprocedural states: Secondary | ICD-10-CM | POA: Diagnosis not present

## 2019-02-14 DIAGNOSIS — R062 Wheezing: Secondary | ICD-10-CM | POA: Diagnosis not present

## 2019-02-14 DIAGNOSIS — J988 Other specified respiratory disorders: Secondary | ICD-10-CM | POA: Diagnosis not present

## 2019-11-01 ENCOUNTER — Other Ambulatory Visit: Payer: Self-pay | Admitting: Nurse Practitioner

## 2019-11-01 ENCOUNTER — Other Ambulatory Visit: Payer: Self-pay

## 2019-11-01 ENCOUNTER — Ambulatory Visit
Admission: RE | Admit: 2019-11-01 | Discharge: 2019-11-01 | Disposition: A | Discharge status: Home / Self Care | Payer: No Typology Code available for payment source | Source: Ambulatory Visit | Attending: Nurse Practitioner | Admitting: Nurse Practitioner

## 2019-11-01 DIAGNOSIS — Z021 Encounter for pre-employment examination: Secondary | ICD-10-CM

## 2021-06-06 IMAGING — CR DG CHEST 2V
2 series · 2 of 2 positions shown · non-contrast
Comparison: None.

CLINICAL DATA: Employment physical examination

EXAM:
CHEST - 2 VIEW

[w chest pa]
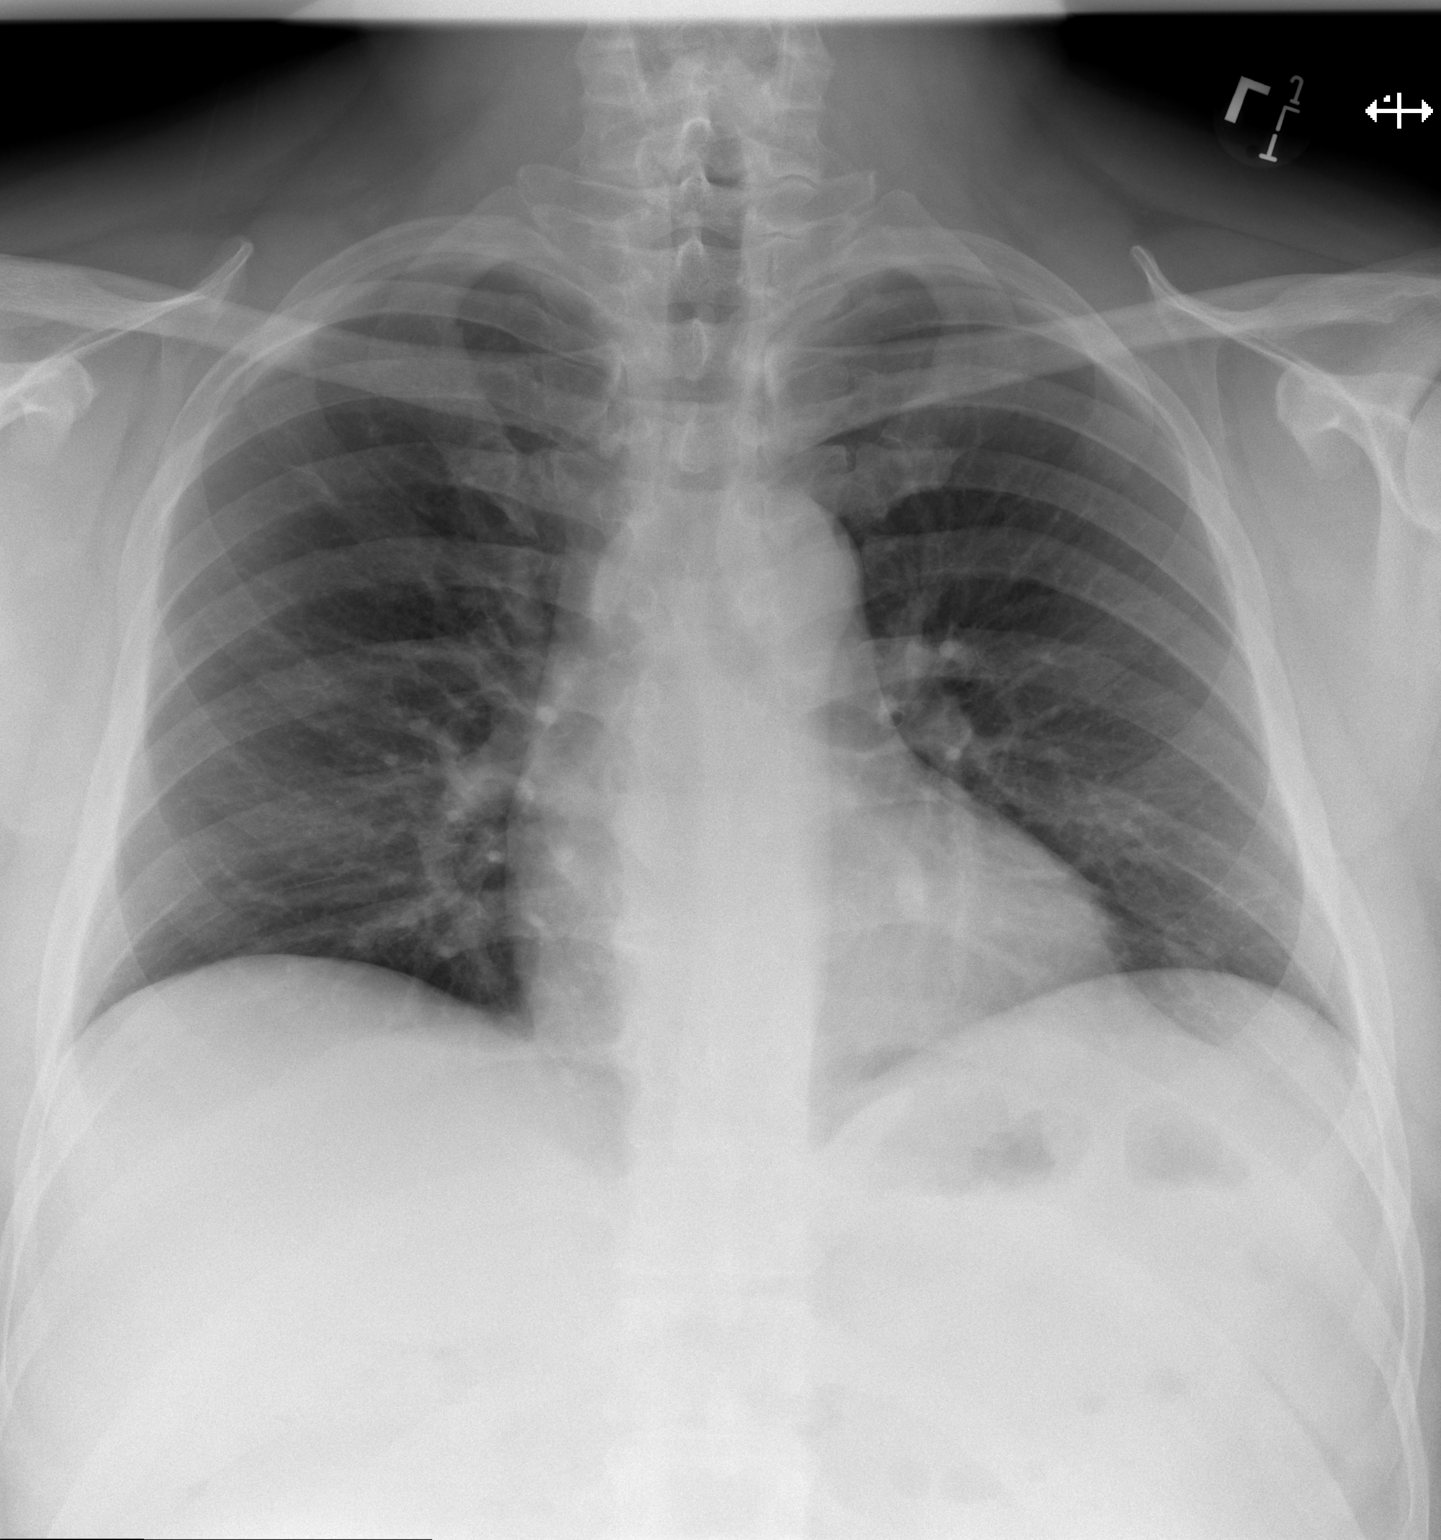

[w chest lat]
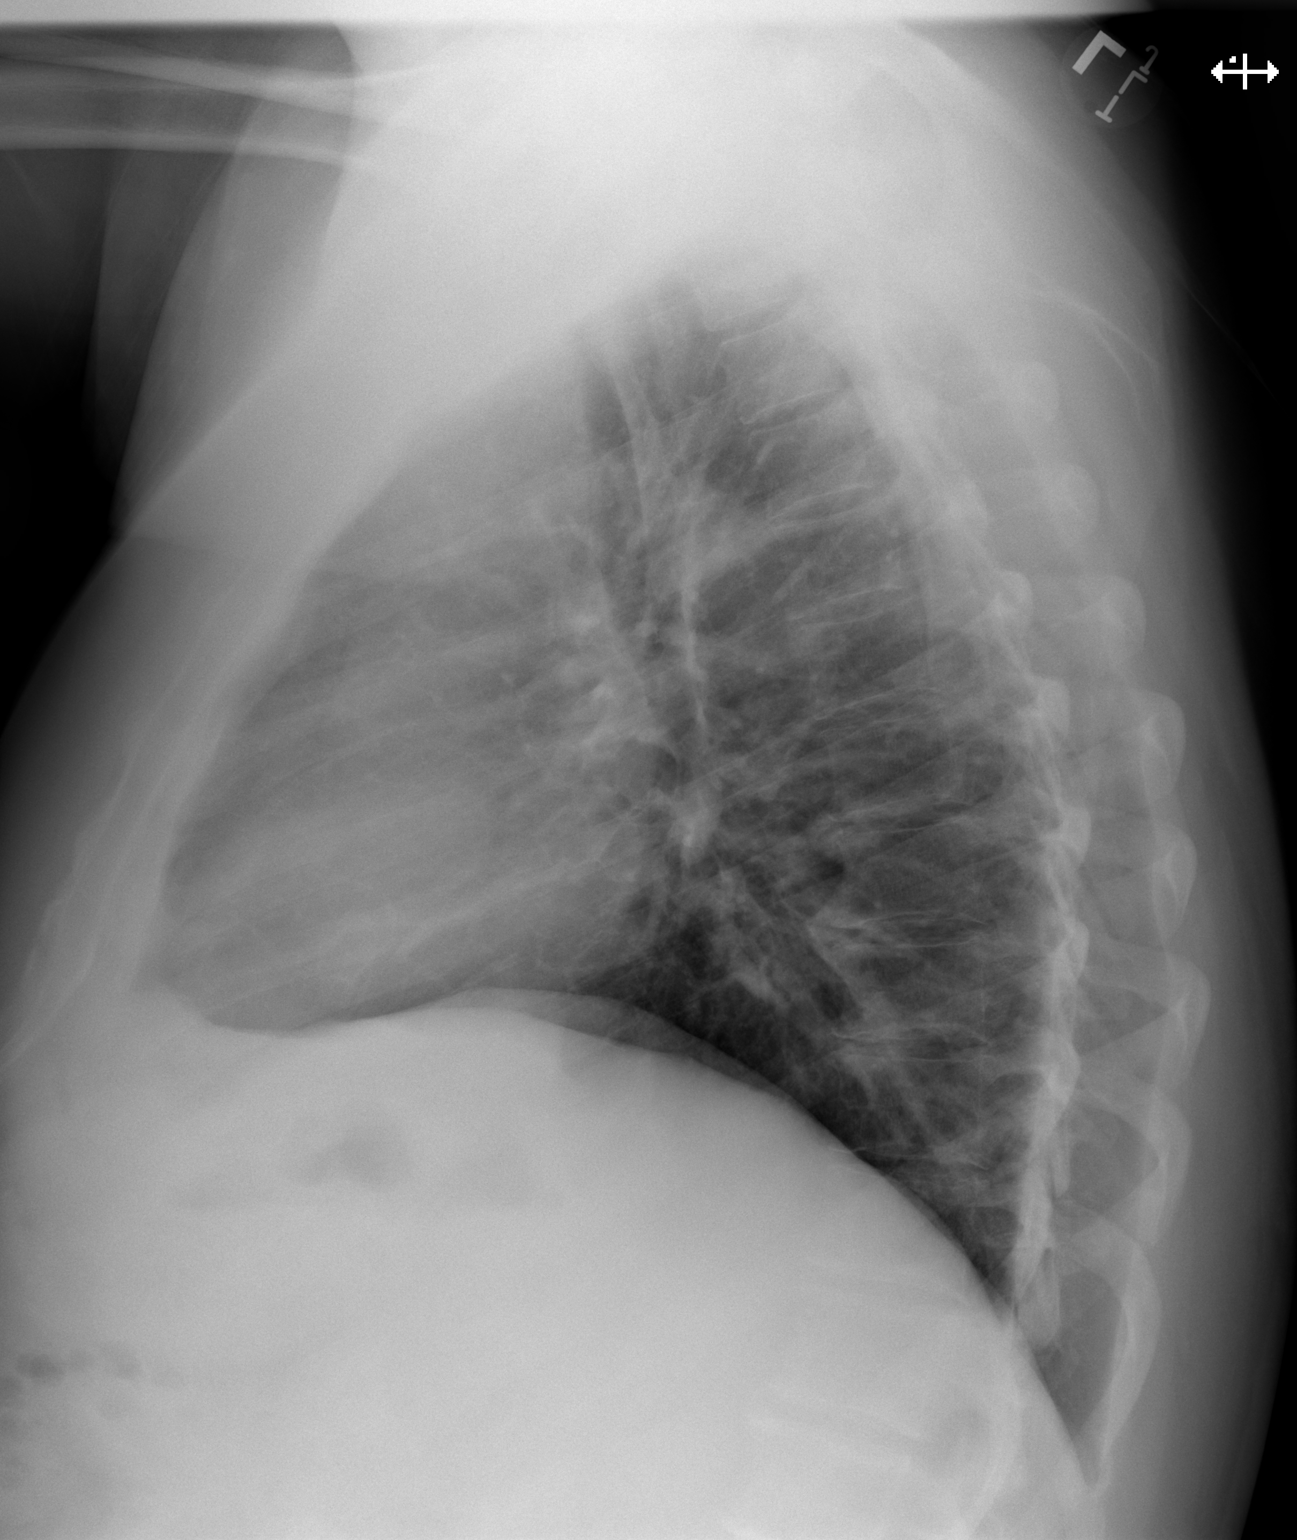

[2 of 2 positions shown; findings below may reference images not displayed]

FINDINGS: Lungs are clear. Heart size and pulmonary vascularity are normal. No
adenopathy. There is mild degenerative change in the thoracic spine.
IMPRESSION: Lungs clear.  No adenopathy.

## 2021-10-29 ENCOUNTER — Telehealth: Payer: Self-pay | Admitting: Physician Assistant

## 2021-10-29 NOTE — Telephone Encounter (Signed)
This patient is booked for a NEW PATIENT appointment on 04/29/2022 @11 :00 with 01, PA-C and wants sooner appointment.  His daughter is Lucan Riner (MRN Rolland Porter).

## 2021-10-30 NOTE — Telephone Encounter (Signed)
Called patient and moved appointment up.

## 2021-12-12 ENCOUNTER — Ambulatory Visit: Payer: No Typology Code available for payment source | Admitting: Physician Assistant

## 2022-04-08 ENCOUNTER — Ambulatory Visit (INDEPENDENT_AMBULATORY_CARE_PROVIDER_SITE_OTHER): Payer: 59 | Admitting: Physician Assistant

## 2022-04-08 DIAGNOSIS — Z808 Family history of malignant neoplasm of other organs or systems: Secondary | ICD-10-CM | POA: Diagnosis not present

## 2022-04-08 DIAGNOSIS — Z1283 Encounter for screening for malignant neoplasm of skin: Secondary | ICD-10-CM | POA: Diagnosis not present

## 2022-04-16 ENCOUNTER — Encounter: Payer: Self-pay | Admitting: Physician Assistant

## 2022-04-16 NOTE — Progress Notes (Signed)
   New Patient   Subjective  Mitchell Rodriguez is a 51 y.o. male who presents for the following: Annual Exam (Here for skin exam. No concerns. Does have place on nose that that wont go away. Family history of skin cancer. Daughter had melanoma. ).   The following portions of the chart were reviewed this encounter and updated as appropriate:  Tobacco  Allergies  Meds  Problems  Med Hx  Surg Hx  Fam Hx      Objective  Well appearing patient in no apparent distress; mood and affect are within normal limits.  All skin waist up and feet examined.  Daughter has history of MM on the toe. No atypical nevi or signs of NMSC noted at the time of the visit.    Assessment & Plan  Screening exam for skin cancer  Yearly skin checks     I, Miron Marxen, PA-C, have reviewed all documentation's for this visit.  The documentation on 04/16/22 for the exam, diagnosis, procedures and orders are all accurate and complete.

## 2022-04-29 ENCOUNTER — Ambulatory Visit: Payer: No Typology Code available for payment source | Admitting: Physician Assistant

## 2022-06-25 ENCOUNTER — Other Ambulatory Visit: Payer: Self-pay | Admitting: Family Medicine

## 2022-06-25 ENCOUNTER — Other Ambulatory Visit (HOSPITAL_COMMUNITY): Payer: Self-pay | Admitting: Family Medicine

## 2022-06-25 DIAGNOSIS — E7800 Pure hypercholesterolemia, unspecified: Secondary | ICD-10-CM

## 2022-07-03 ENCOUNTER — Other Ambulatory Visit: Payer: Self-pay | Admitting: Orthopedic Surgery

## 2022-07-10 ENCOUNTER — Ambulatory Visit (HOSPITAL_COMMUNITY)
Admission: RE | Admit: 2022-07-10 | Discharge: 2022-07-10 | Disposition: A | Discharge status: Home / Self Care | Payer: No Typology Code available for payment source | Source: Ambulatory Visit | Attending: Family Medicine | Admitting: Family Medicine

## 2022-07-10 DIAGNOSIS — E7800 Pure hypercholesterolemia, unspecified: Secondary | ICD-10-CM | POA: Insufficient documentation

## 2022-08-08 ENCOUNTER — Other Ambulatory Visit: Payer: Self-pay

## 2022-08-08 ENCOUNTER — Encounter (HOSPITAL_BASED_OUTPATIENT_CLINIC_OR_DEPARTMENT_OTHER): Payer: Self-pay | Admitting: Orthopedic Surgery

## 2022-08-08 NOTE — Progress Notes (Addendum)
   08/08/22 1157  PAT Phone Screen  Do You Have Diabetes? (S)  Yes (hold Ozempic 1 week prior to sx)  Do You Have Hypertension? Yes  Have You Ever Been to the ER for Asthma? No  Have You Taken Oral Steroids in the Past 3 Months? No  Do you Take Phenteramine or any Other Diet Drugs? No  Recent  Lab Work, EKG, CXR? No  Do you have a history of heart problems? No  Have You Ever Had Tests on Your Heart? No  Any Recent Hospitalizations? No  Height 6\' 4"  (1.93 m)  Weight 124.7 kg  Pat Appointment Scheduled (S)  Yes (coming in for PAT)   07-10-22 CT Coronary-calcium score 0

## 2022-08-13 ENCOUNTER — Encounter (HOSPITAL_BASED_OUTPATIENT_CLINIC_OR_DEPARTMENT_OTHER)
Admission: RE | Admit: 2022-08-13 | Discharge: 2022-08-13 | Disposition: A | Discharge status: Home / Self Care | Payer: 59 | Source: Ambulatory Visit | Attending: Orthopedic Surgery | Admitting: Orthopedic Surgery

## 2022-08-13 DIAGNOSIS — E119 Type 2 diabetes mellitus without complications: Secondary | ICD-10-CM | POA: Diagnosis not present

## 2022-08-13 DIAGNOSIS — M65331 Trigger finger, right middle finger: Secondary | ICD-10-CM | POA: Diagnosis not present

## 2022-08-13 DIAGNOSIS — Z7722 Contact with and (suspected) exposure to environmental tobacco smoke (acute) (chronic): Secondary | ICD-10-CM | POA: Diagnosis not present

## 2022-08-13 DIAGNOSIS — Z01810 Encounter for preprocedural cardiovascular examination: Secondary | ICD-10-CM | POA: Diagnosis not present

## 2022-08-13 DIAGNOSIS — Z7984 Long term (current) use of oral hypoglycemic drugs: Secondary | ICD-10-CM | POA: Diagnosis not present

## 2022-08-13 DIAGNOSIS — E669 Obesity, unspecified: Secondary | ICD-10-CM | POA: Diagnosis not present

## 2022-08-13 DIAGNOSIS — I10 Essential (primary) hypertension: Secondary | ICD-10-CM | POA: Diagnosis not present

## 2022-08-13 DIAGNOSIS — Z794 Long term (current) use of insulin: Secondary | ICD-10-CM | POA: Diagnosis not present

## 2022-08-13 DIAGNOSIS — Z6833 Body mass index (BMI) 33.0-33.9, adult: Secondary | ICD-10-CM | POA: Diagnosis not present

## 2022-08-13 LAB — BASIC METABOLIC PANEL
Anion gap: 11 (ref 5–15)
BUN: 9 mg/dL (ref 6–20)
CO2: 25 mmol/L (ref 22–32)
Calcium: 8.8 mg/dL — ABNORMAL LOW (ref 8.9–10.3)
Chloride: 105 mmol/L (ref 98–111)
Creatinine, Ser: 0.76 mg/dL (ref 0.61–1.24)
GFR, Estimated: >60 mL/min (ref >60)
Glucose, Bld: 101 mg/dL — ABNORMAL HIGH (ref 70–99)
Potassium: 4.1 mmol/L (ref 3.5–5.1)
Sodium: 141 mmol/L (ref 135–145)

## 2022-08-13 NOTE — Progress Notes (Signed)

## 2022-08-14 ENCOUNTER — Ambulatory Visit (HOSPITAL_BASED_OUTPATIENT_CLINIC_OR_DEPARTMENT_OTHER): Payer: 59 | Admitting: Anesthesiology

## 2022-08-14 ENCOUNTER — Encounter (HOSPITAL_BASED_OUTPATIENT_CLINIC_OR_DEPARTMENT_OTHER): Payer: Self-pay | Admitting: Orthopedic Surgery

## 2022-08-14 ENCOUNTER — Ambulatory Visit (HOSPITAL_BASED_OUTPATIENT_CLINIC_OR_DEPARTMENT_OTHER)
Admission: RE | Admit: 2022-08-14 | Discharge: 2022-08-14 | Disposition: A | Discharge status: Home / Self Care | Payer: 59 | Attending: Orthopedic Surgery | Admitting: Orthopedic Surgery

## 2022-08-14 ENCOUNTER — Encounter (HOSPITAL_BASED_OUTPATIENT_CLINIC_OR_DEPARTMENT_OTHER)
Admission: RE | Disposition: A | Discharge status: Home / Self Care | Payer: Self-pay | Source: Home / Self Care | Attending: Orthopedic Surgery

## 2022-08-14 DIAGNOSIS — M65331 Trigger finger, right middle finger: Secondary | ICD-10-CM | POA: Insufficient documentation

## 2022-08-14 DIAGNOSIS — Z794 Long term (current) use of insulin: Secondary | ICD-10-CM

## 2022-08-14 DIAGNOSIS — I10 Essential (primary) hypertension: Secondary | ICD-10-CM | POA: Insufficient documentation

## 2022-08-14 DIAGNOSIS — E119 Type 2 diabetes mellitus without complications: Secondary | ICD-10-CM | POA: Insufficient documentation

## 2022-08-14 DIAGNOSIS — Z7722 Contact with and (suspected) exposure to environmental tobacco smoke (acute) (chronic): Secondary | ICD-10-CM | POA: Insufficient documentation

## 2022-08-14 DIAGNOSIS — Z7984 Long term (current) use of oral hypoglycemic drugs: Secondary | ICD-10-CM

## 2022-08-14 DIAGNOSIS — E669 Obesity, unspecified: Secondary | ICD-10-CM | POA: Insufficient documentation

## 2022-08-14 DIAGNOSIS — Z6833 Body mass index (BMI) 33.0-33.9, adult: Secondary | ICD-10-CM | POA: Insufficient documentation

## 2022-08-14 HISTORY — PX: TRIGGER FINGER RELEASE: SHX641

## 2022-08-14 LAB — GLUCOSE, CAPILLARY
Glucose-Capillary: 159 mg/dL — ABNORMAL HIGH (ref 70–99)
Glucose-Capillary: 121 mg/dL — ABNORMAL HIGH (ref 70–99)

## 2022-08-14 SURGERY — RELEASE, A1 PULLEY, FOR TRIGGER FINGER
Anesthesia: Monitor Anesthesia Care | Site: Hand | Laterality: Right

## 2022-08-14 MED ORDER — FENTANYL CITRATE (PF) 100 MCG/2ML IJ SOLN
INTRAMUSCULAR | Status: AC
  Filled 2022-08-14: qty 2

## 2022-08-14 MED ORDER — CEFAZOLIN IN SODIUM CHLORIDE 3-0.9 GM/100ML-% IV SOLN
INTRAVENOUS | Status: AC
  Filled 2022-08-14: qty 100

## 2022-08-14 MED ORDER — FENTANYL CITRATE (PF) 100 MCG/2ML IJ SOLN: 25.0000 ug | INTRAMUSCULAR | Status: DC | PRN

## 2022-08-14 MED ORDER — BUPIVACAINE HCL (PF) 0.25 % IJ SOLN
INTRAMUSCULAR | Status: DC | PRN
  Administered 2022-08-14: 9 mL

## 2022-08-14 MED ORDER — ACETAMINOPHEN 10 MG/ML IV SOLN: 1000.0000 mg | Freq: Once | INTRAVENOUS | Status: DC | PRN

## 2022-08-14 MED ORDER — AMISULPRIDE (ANTIEMETIC) 5 MG/2ML IV SOLN: 10.0000 mg | Freq: Once | INTRAVENOUS | Status: DC | PRN

## 2022-08-14 MED ORDER — ONDANSETRON HCL 4 MG/2ML IJ SOLN
INTRAMUSCULAR | Status: AC
  Filled 2022-08-14: qty 2

## 2022-08-14 MED ORDER — PROPOFOL 10 MG/ML IV BOLUS
INTRAVENOUS | Status: AC
  Filled 2022-08-14: qty 20

## 2022-08-14 MED ORDER — PROPOFOL 500 MG/50ML IV EMUL
INTRAVENOUS | Status: DC | PRN
  Administered 2022-08-14: 50 ug/kg/min via INTRAVENOUS

## 2022-08-14 MED ORDER — 0.9 % SODIUM CHLORIDE (POUR BTL) OPTIME
TOPICAL | Status: DC | PRN
  Administered 2022-08-14: 50 mL

## 2022-08-14 MED ORDER — FENTANYL CITRATE (PF) 100 MCG/2ML IJ SOLN
INTRAMUSCULAR | Status: DC | PRN
  Administered 2022-08-14: 100 ug via INTRAVENOUS

## 2022-08-14 MED ORDER — LACTATED RINGERS IV SOLN: INTRAVENOUS | Status: DC

## 2022-08-14 MED ORDER — MIDAZOLAM HCL 5 MG/5ML IJ SOLN
INTRAMUSCULAR | Status: DC | PRN
  Administered 2022-08-14: 2 mg via INTRAVENOUS

## 2022-08-14 MED ORDER — PROPOFOL 10 MG/ML IV BOLUS
INTRAVENOUS | Status: DC | PRN
  Administered 2022-08-14: 20 mg via INTRAVENOUS

## 2022-08-14 MED ORDER — PROMETHAZINE HCL 25 MG/ML IJ SOLN: 6.2500 mg | INTRAMUSCULAR | Status: DC | PRN

## 2022-08-14 MED ORDER — KETOROLAC TROMETHAMINE 30 MG/ML IJ SOLN: 30.0000 mg | Freq: Once | INTRAMUSCULAR | Status: DC

## 2022-08-14 MED ORDER — CEFAZOLIN SODIUM-DEXTROSE 2-4 GM/100ML-% IV SOLN
2.0000 g | INTRAVENOUS | Status: AC
  Administered 2022-08-14: 3 g via INTRAVENOUS

## 2022-08-14 MED ORDER — MIDAZOLAM HCL 2 MG/2ML IJ SOLN
INTRAMUSCULAR | Status: AC
  Filled 2022-08-14: qty 2

## 2022-08-14 MED ORDER — TRAMADOL HCL 50 MG PO TABS: ORAL_TABLET | ORAL | 0 refills | Status: AC

## 2022-08-14 MED ORDER — LIDOCAINE 2% (20 MG/ML) 5 ML SYRINGE
INTRAMUSCULAR | Status: AC
  Filled 2022-08-14: qty 5

## 2022-08-14 MED ORDER — ACETAMINOPHEN 500 MG PO TABS
ORAL_TABLET | ORAL | Status: AC
  Filled 2022-08-14: qty 2

## 2022-08-14 MED ORDER — OXYCODONE HCL 5 MG/5ML PO SOLN: 5.0000 mg | Freq: Once | ORAL | Status: DC | PRN

## 2022-08-14 MED ORDER — OXYCODONE HCL 5 MG PO TABS: 5.0000 mg | ORAL_TABLET | Freq: Once | ORAL | Status: DC | PRN

## 2022-08-14 MED ORDER — LIDOCAINE HCL (PF) 0.5 % IJ SOLN
INTRAMUSCULAR | Status: DC | PRN
  Administered 2022-08-14: 35 mL via INTRAVENOUS

## 2022-08-14 SURGICAL SUPPLY — 33 items
APL PRP STRL LF DISP 70% ISPRP (MISCELLANEOUS) ×1
BLADE SURG 15 STRL LF DISP TIS (BLADE) ×4 IMPLANT
BLADE SURG 15 STRL SS (BLADE) ×2
BNDG CMPR 5X2 CHSV 1 LYR STRL (GAUZE/BANDAGES/DRESSINGS) ×1
BNDG CMPR 9X4 STRL LF SNTH (GAUZE/BANDAGES/DRESSINGS) ×1
BNDG COHESIVE 2X5 TAN ST LF (GAUZE/BANDAGES/DRESSINGS) ×2 IMPLANT
BNDG ESMARK 4X9 LF (GAUZE/BANDAGES/DRESSINGS) IMPLANT
CHLORAPREP W/TINT 26 (MISCELLANEOUS) ×2 IMPLANT
CORD BIPOLAR FORCEPS 12FT (ELECTRODE) ×2 IMPLANT
COVER BACK TABLE 60X90IN (DRAPES) ×2 IMPLANT
COVER MAYO STAND STRL (DRAPES) ×2 IMPLANT
CUFF TOURN SGL QUICK 18X4 (TOURNIQUET CUFF) ×2 IMPLANT
DRAPE EXTREMITY T 121X128X90 (DISPOSABLE) ×2 IMPLANT
DRAPE SURG 17X23 STRL (DRAPES) ×2 IMPLANT
GAUZE SPONGE 4X4 12PLY STRL (GAUZE/BANDAGES/DRESSINGS) ×2 IMPLANT
GAUZE XEROFORM 1X8 LF (GAUZE/BANDAGES/DRESSINGS) ×2 IMPLANT
GLOVE BIO SURGEON STRL SZ7.5 (GLOVE) ×2 IMPLANT
GLOVE BIOGEL PI IND STRL 7.0 (GLOVE) IMPLANT
GLOVE BIOGEL PI IND STRL 8 (GLOVE) ×2 IMPLANT
GLOVE SURG SS PI 6.5 STRL IVOR (GLOVE) IMPLANT
GOWN STRL REUS W/ TWL LRG LVL3 (GOWN DISPOSABLE) ×2 IMPLANT
GOWN STRL REUS W/TWL LRG LVL3 (GOWN DISPOSABLE) ×1
GOWN STRL REUS W/TWL XL LVL3 (GOWN DISPOSABLE) ×2 IMPLANT
NDL HYPO 25X1 1.5 SAFETY (NEEDLE) ×2 IMPLANT
NEEDLE HYPO 25X1 1.5 SAFETY (NEEDLE) ×1 IMPLANT
NS IRRIG 1000ML POUR BTL (IV SOLUTION) ×2 IMPLANT
PACK BASIN DAY SURGERY FS (CUSTOM PROCEDURE TRAY) ×2 IMPLANT
STOCKINETTE 4X48 STRL (DRAPES) ×2 IMPLANT
SUT ETHILON 4 0 PS 2 18 (SUTURE) ×2 IMPLANT
SYR BULB EAR ULCER 3OZ GRN STR (SYRINGE) ×2 IMPLANT
SYR CONTROL 10ML LL (SYRINGE) ×2 IMPLANT
TOWEL GREEN STERILE FF (TOWEL DISPOSABLE) ×4 IMPLANT
UNDERPAD 30X36 HEAVY ABSORB (UNDERPADS AND DIAPERS) ×2 IMPLANT

## 2022-08-14 NOTE — Discharge Instructions (Addendum)

## 2022-08-14 NOTE — Op Note (Signed)
08/14/2022 [facility]  Operative Note  PREOPERATIVE DIAGNOSIS: RIGHT MIDDLE FINGER TRIGGER  POSTOPERATIVE DIAGNOSIS:  RIGHT MIDDLE FINGER TRIGGER  PROCEDURE: Procedure(s): RELEASE TRIGGER FINGER/A-1 PULLEY RIGHT MIDDLE FINGER  SURGEON:  Leanora Cover, MD  ASSISTANT:  none.  ANESTHESIA:  Bier block with sedation.  IV FLUIDS:  Per anesthesia flow sheet.  ESTIMATED BLOOD LOSS:  Minimal.  COMPLICATIONS:  None.  SPECIMENS:  None.  TOURNIQUET TIME:  Total Tourniquet Time Documented: Forearm (Right) - 17 minutes Total: Forearm (Right) - 17 minutes   DISPOSITION:  Stable to PACU.  LOCATION: [facility]  INDICATIONS: Mitchell Rodriguez is a 51 y.o. male with triggering right long finger.  This has been injected without lasting resolution.  He wishes to have trigger release.  Risks, benefits and alternatives of surgery were discussed including the risk of blood loss, infection, damage to nerves, vessels, tendons, ligaments, bone, failure of surgery, need for additional surgery, complications with wound healing, continued pain, continued triggering and need for repeat surgery.  He voiced understanding of these risks and elected to proceed.  OPERATIVE COURSE:  After being identified preoperatively by myself, the patient and I agreed upon the procedure and site of procedure.  The surgical site was marked. Surgical consent had been signed. He was given preoperative antibiotic prophylaxis. He was transported to the operating room and placed on the operating room table in supine position with the Right upper extremity on an arm board. Bier block anesthesia was induced by the anesthesiologist.  The Right upper extremity was prepped and draped in normal sterile orthopedic fashion. A surgical pause was performed between surgeons, anesthesia, and operating room staff, and all were in agreement as to the patient, procedure, and site of procedure.  Tourniquet at the proximal  aspect of the forearm had been inflated for the Bier block.  An incision was made at the volar aspect of the MP joint of the long finger.  This was carried into the subcutaneous tissues by spreading technique.  Bipolar electrocautery was used to obtain hemostasis.  The radial and ulnar digital nerves were protected throughout the case. The flexor sheath was identified.  The A1 pulley was identified and sharply incised.  It was released in its entirety.  The proximal 1-2 mm of the A2 pulley was vented to allow better excursion of the tendons.  The finger was placed through a range of motion and there was noted to be no catching.  The tendons were brought through the wound and any adherences released.  The wound was then copiously irrigated with sterile saline. It was closed with 4-0 nylon in a horizontal mattress fashion.  It was injected with 0.25% plain Marcaine to aid in postoperative analgesia.  It was dressed with sterile Xeroform, 4x4s, and wrapped lightly with a Coban dressing.  Tourniquet was deflated at 17 minutes.  The fingertips were pink with brisk capillary refill after deflation of the tourniquet.  The operative drapes were broken down and the patient was awoken from anesthesia safely.  He was transferred back to the stretcher and taken to the PACU in stable condition.   I will see him back in the office in 1 week for postoperative followup.  I will give him a prescription for Tramadol 50 mg 1-2 tabs PO q6 hours prn pain, dispense # 20.    Leanora Cover, MD Electronically signed, 08/14/22

## 2022-08-14 NOTE — H&P (Signed)
Mitchell Rodriguez is an 51 y.o. male.   Chief Complaint: trigger digit HPI: 51 yo male with triggering right long finger.  Has had injections without lasting resolution.  He wishes to proceed with trigger release.  Allergies:  Allergies  Allergen Reactions   Elemental Sulfur    Sulfamethoxazole Other (See Comments)    unknown    Past Medical History:  Diagnosis Date   Allergic rhinitis    Diabetes mellitus without complication (HCC)    GERD (gastroesophageal reflux disease)    Hypertension    Recurrent upper respiratory infection (URI)     Past Surgical History:  Procedure Laterality Date   ESOPHAGEAL DILATION     HAND SURGERY     KNEE SURGERY Right    acl repair   SHOULDER SURGERY Bilateral    rotator cuff repair    Family History: Family History  Problem Relation Age of Onset   Cancer - Other Mother        uterine   Heart disease Father    Hypertension Father    Diabetes Father    Heart disease Maternal Grandmother    COPD Maternal Grandmother    Leukemia Paternal Grandfather    Hypertension Brother     Social History:   reports that he has never smoked. He has been exposed to tobacco smoke. He has never used smokeless tobacco. He reports current alcohol use of about 1.0 - 2.0 standard drink of alcohol per week. He reports that he does not use drugs.  Medications: Medications Prior to Admission  Medication Sig Dispense Refill   insulin glargine (LANTUS SOLOSTAR) 100 UNIT/ML Solostar Pen 15 Units at bedtime.     losartan (COZAAR) 50 MG tablet Take 1 tablet by mouth daily.     metFORMIN (GLUCOPHAGE-XR) 500 MG 24 hr tablet 4 tabletS     OZEMPIC, 0.25 OR 0.5 MG/DOSE, 2 MG/1.5ML SOPN Inject into the skin.     azelastine (ASTELIN) 0.1 % nasal spray USE 1 SPRAY IN EACH NOSTRIL TWICE DAILY AS NEEDED 30 mL 0   fluticasone (FLONASE) 50 MCG/ACT nasal spray USE 1 SPRAY IN EACH NOSTRIL ONCE A DAY AS NEEDED FOR ALLERGIES OR RHINITIS 16 g 0   Insulin Pen Needle (B-D  ULTRAFINE III SHORT PEN) 31G X 8 MM MISC See admin instructions.     ONETOUCH ULTRA test strip      sildenafil (VIAGRA) 100 MG tablet 1  to 1/2 tablet as needed      Results for orders placed or performed during the hospital encounter of 08/14/22 (from the past 48 hour(s))  Basic metabolic panel per protocol     Status: Abnormal   Collection Time: 08/13/22 12:52 PM  Result Value Ref Range   Sodium 141 135 - 145 mmol/L   Potassium 4.1 3.5 - 5.1 mmol/L   Chloride 105 98 - 111 mmol/L   CO2 25 22 - 32 mmol/L   Glucose, Bld 101 (H) 70 - 99 mg/dL    Comment: Glucose reference range applies only to samples taken after fasting for at least 8 hours.   BUN 9 6 - 20 mg/dL   Creatinine, Ser 0.76 0.61 - 1.24 mg/dL   Calcium 8.8 (L) 8.9 - 10.3 mg/dL   GFR, Estimated >60 >60 mL/min    Comment: (NOTE) Calculated using the CKD-EPI Creatinine Equation (2021)    Anion gap 11 5 - 15    Comment: Performed at [facility] [address] [place], [place]  [address]  Glucose, capillary     Status: Abnormal   Collection Time: 08/14/22  7:35 AM  Result Value Ref Range   Glucose-Capillary 159 (H) 70 - 99 mg/dL    Comment: Glucose reference range applies only to samples taken after fasting for at least 8 hours.    No results found.    Blood pressure (!) 143/86, pulse 68, temperature (!) 97 F (36.1 C), temperature source Oral, resp. rate 18, height 6\' 4"  (1.93 m), weight 123.5 kg, SpO2 98 %.  General appearance: alert, cooperative, and appears stated age Head: Normocephalic, without obvious abnormality, atraumatic Neck: supple, symmetrical, trachea midline Extremities: Intact sensation and capillary refill all digits.  +epl/fpl/io.  No wounds.  Pulses: 2+ and symmetric Skin: Skin color, texture, turgor normal. No rashes or lesions Neurologic: Grossly normal Incision/Wound: none  Assessment/Plan Right long finger trigger digit.  Non operative and operative treatment options have been  discussed with the patient and patient wishes to proceed with operative treatment. Risks, benefits, and alternatives of surgery have been discussed and the patient agrees with the plan of care.   08/14/2022, 8:29 AM

## 2022-08-14 NOTE — Anesthesia Postprocedure Evaluation (Signed)
Anesthesia Post Note  Patient: Mitchell Rodriguez  Procedure(s) Performed: RELEASE TRIGGER FINGER/A-1 PULLEY RIGHT MIDDLE FINGER (Right: Hand)     Patient location during evaluation: PACU Anesthesia Type: MAC Level of consciousness: awake Pain management: pain level controlled Vital Signs Assessment: post-procedure vital signs reviewed and stable Respiratory status: spontaneous breathing, nonlabored ventilation, respiratory function stable and patient connected to nasal cannula oxygen Cardiovascular status: stable and blood pressure returned to baseline Postop Assessment: no apparent nausea or vomiting Anesthetic complications: no   No notable events documented.  Last Vitals:  Vitals:   08/14/22 0950 08/14/22 0958  BP: (!) 142/80 (!) 159/85  Pulse: 68 63  Resp: 14 14  Temp:  36.6 C  SpO2: 96% 96%    Last Pain:  Vitals:   08/14/22 0958  TempSrc:   PainSc: 0-No pain                 Amanie Mcculley P Petrita Blunck

## 2022-08-14 NOTE — Anesthesia Preprocedure Evaluation (Addendum)
Anesthesia Evaluation  Patient identified by MRN, date of birth, ID band Patient awake    Reviewed: Allergy & Precautions, NPO status , Patient's Chart, lab work & pertinent test results  Airway Mallampati: II  TM Distance: >3 FB Neck ROM: Full    Dental no notable dental hx.    Pulmonary  Per patient allergies versus exercise-induced asthma   Pulmonary exam normal        Cardiovascular hypertension, Pt. on medications Normal cardiovascular exam     Neuro/Psych negative neurological ROS  negative psych ROS   GI/Hepatic Neg liver ROS, GERD  ,  Endo/Other  diabetes, Insulin Dependent, Oral Hypoglycemic Agents  Renal/GU negative Renal ROS     Musculoskeletal negative musculoskeletal ROS (+)   Abdominal (+) + obese,   Peds  Hematology negative hematology ROS (+)   Anesthesia Other Findings RIGHT MIDDLE FINGER TRIGGER  Reproductive/Obstetrics                           Anesthesia Physical Anesthesia Plan  ASA: 3  Anesthesia Plan: MAC   Post-op Pain Management:    Induction: Intravenous  PONV Risk Score and Plan: 1 and Ondansetron, Dexamethasone, Propofol infusion, Midazolam and Treatment may vary due to age or medical condition  Airway Management Planned: Simple Face Mask  Additional Equipment:   Intra-op Plan:   Post-operative Plan:   Informed Consent: I have reviewed the patients History and Physical, chart, labs and discussed the procedure including the risks, benefits and alternatives for the proposed anesthesia with the patient or authorized representative who has indicated his/her understanding and acceptance.     Dental advisory given  Plan Discussed with: CRNA  Anesthesia Plan Comments:         Anesthesia Quick Evaluation

## 2022-08-14 NOTE — Transfer of Care (Signed)
Immediate Anesthesia Transfer of Care Note  Patient: Mitchell Rodriguez  Procedure(s) Performed: RELEASE TRIGGER FINGER/A-1 PULLEY RIGHT MIDDLE FINGER (Right: Hand)  Patient Location: PACU  Anesthesia Type:MAC and Bier block  Level of Consciousness: awake, alert  and oriented  Airway & Oxygen Therapy: Patient Spontanous Breathing  Post-op Assessment: Report given to RN and Post -op Vital signs reviewed and stable  Post vital signs: Reviewed and stable  Last Vitals:  Vitals Value Taken Time  BP 136/88 08/14/22 0932  Temp    Pulse 62 08/14/22 0934  Resp 0 08/14/22 0934  SpO2 97 % 08/14/22 0934  Vitals shown include unvalidated device data.  Last Pain:  Vitals:   08/14/22 0724  TempSrc: Oral  PainSc: 2          Complications: No notable events documented.

## 2022-08-15 ENCOUNTER — Encounter (HOSPITAL_BASED_OUTPATIENT_CLINIC_OR_DEPARTMENT_OTHER): Payer: Self-pay | Admitting: Orthopedic Surgery
